# Patient Record
Sex: Male | Born: 2000 | Race: Black or African American | Hispanic: No | Marital: Single | Smoking: Never smoker
Health system: Southern US, Community
[De-identification: ages and names within clinical notes are randomized; demographics above are authoritative.]

---

## 2020-04-07 ENCOUNTER — Emergency Department (HOSPITAL_BASED_OUTPATIENT_CLINIC_OR_DEPARTMENT_OTHER)
Admission: EM | Admit: 2020-04-07 | Discharge: 2020-04-08 | Disposition: A | Discharge status: Home / Self Care | Payer: 59 | Attending: Emergency Medicine | Admitting: Emergency Medicine

## 2020-04-07 ENCOUNTER — Other Ambulatory Visit: Payer: Self-pay

## 2020-04-07 ENCOUNTER — Encounter (HOSPITAL_BASED_OUTPATIENT_CLINIC_OR_DEPARTMENT_OTHER): Payer: Self-pay | Admitting: *Deleted

## 2020-04-07 DIAGNOSIS — H9201 Otalgia, right ear: Secondary | ICD-10-CM | POA: Diagnosis present

## 2020-04-07 DIAGNOSIS — H66001 Acute suppurative otitis media without spontaneous rupture of ear drum, right ear: Secondary | ICD-10-CM | POA: Insufficient documentation

## 2020-04-07 NOTE — ED Triage Notes (Signed)
Pt c/o right ear pain since 2130

## 2020-04-08 DIAGNOSIS — H66001 Acute suppurative otitis media without spontaneous rupture of ear drum, right ear: Secondary | ICD-10-CM | POA: Diagnosis not present

## 2020-04-08 MED ORDER — SALINE SPRAY 0.65 % NA SOLN: 1.0000 | NASAL | 0 refills | Status: AC | PRN

## 2020-04-08 MED ORDER — AMOXICILLIN-POT CLAVULANATE 875-125 MG PO TABS: 1.0000 | ORAL_TABLET | Freq: Two times a day (BID) | ORAL | 0 refills | Status: AC

## 2020-04-08 MED ORDER — KETOROLAC TROMETHAMINE 15 MG/ML IJ SOLN
15.0000 mg | Freq: Once | INTRAMUSCULAR | Status: AC
  Administered 2020-04-08: 15 mg via INTRAMUSCULAR
  Filled 2020-04-08: qty 1

## 2020-04-08 MED ORDER — FLUTICASONE PROPIONATE 50 MCG/ACT NA SUSP: 2.0000 | Freq: Every day | NASAL | 0 refills | Status: AC

## 2020-04-08 NOTE — Discharge Instructions (Addendum)
You were seen today for pain in the right ear.  You have evidence of otitis media.  Sometimes this can be virally mediated.  For the next 1 to 2 days take ibuprofen as needed at home for pain.  Use nasal saline and Flonase to flush the nose and try to drain the middle ear.  If you develop fevers or worsening pain and symptoms, you may get antibiotic filled.

## 2020-04-08 NOTE — ED Provider Notes (Signed)
[facility] EMERGENCY DEPARTMENT Provider Note   CSN: [id number] Arrival date & time: 04/07/20  2307     History Chief Complaint  Patient presents with  . Otalgia    Logan Daniels is a 19 y.o. male.  HPI     This is a 19 year old male who presents with right ear pain.  Patient reports acute onset of right ear pain tonight.  States he has had pain for 2 hours.  Rates his pain a 10 out of 10.  He has not taken anything for pain.  The pain radiates into his right neck.  He states he has had somewhat of a sore throat.  No fevers or chills.  No headache.  No recent sick contacts.  Denies congestion.  Patient does state that he felt like he had difficulty hearing earlier today and thinks something may have gotten into his ear.  History reviewed. No pertinent past medical history.  There are no problems to display for this patient.   History reviewed. No pertinent surgical history.     No family history on file.  Social History   Tobacco Use  . Smoking status: Never Smoker  . Smokeless tobacco: Never Used  Substance Use Topics  . Alcohol use: Never  . Drug use: Never    Home Medications Prior to Admission medications   Medication Sig Start Date End Date Taking? Authorizing Provider  amoxicillin-clavulanate (AUGMENTIN) 875-125 MG tablet Take 1 tablet by mouth every 12 (twelve) hours. 04/08/20   Melecio Cueto, Mayer Masker, MD  fluticasone (FLONASE) 50 MCG/ACT nasal spray Place 2 sprays into both nostrils daily. 04/08/20   Adalina Dopson, Mayer Masker, MD  sodium chloride (OCEAN) 0.65 % SOLN nasal spray Place 1 spray into both nostrils as needed for congestion. 04/08/20   Brittania Sudbeck, Mayer Masker, MD    Allergies    Patient has no known allergies.  Review of Systems   Review of Systems  Constitutional: Negative for fever.  HENT: Positive for ear pain and sore throat. Negative for congestion.   Respiratory: Negative for shortness of breath.   Cardiovascular: Negative for chest pain.    Gastrointestinal: Negative for abdominal pain.  Genitourinary: Negative for dysuria.  All other systems reviewed and are negative.   Physical Exam Updated Vital Signs BP 131/80 (BP Location: Right Arm)   Pulse 71   Temp 98.8 F (37.1 C) (Oral)   Resp 16   Ht 1.88 m (6\' 2" )   Wt 79.4 kg   SpO2 100%   BMI 22.47 kg/m   Physical Exam Vitals and nursing note reviewed.  Constitutional:      Appearance: He is well-developed.  HENT:     Head: Normocephalic and atraumatic.     Left Ear: Tympanic membrane normal.     Ears:     Comments: Right TM slightly bulging with erythema, purulent effusion noted, TM intact    Nose: Nose normal.     Mouth/Throat:     Mouth: Mucous membranes are moist.     Pharynx: Oropharynx is clear. No posterior oropharyngeal erythema.  Eyes:     Pupils: Pupils are equal, round, and reactive to light.  Cardiovascular:     Rate and Rhythm: Normal rate and regular rhythm.     Heart sounds: Normal heart sounds.  Pulmonary:     Effort: Pulmonary effort is normal. No respiratory distress.     Breath sounds: Normal breath sounds.  Abdominal:     Palpations: Abdomen is soft.  Tenderness: There is no abdominal tenderness.  Musculoskeletal:     Cervical back: Neck supple.     Right lower leg: No edema.     Left lower leg: No edema.  Lymphadenopathy:     Cervical: Cervical adenopathy present.  Skin:    General: Skin is warm and dry.  Neurological:     Mental Status: He is alert and oriented to person, place, and time.  Psychiatric:        Mood and Affect: Mood normal.     ED Results / Procedures / Treatments   Labs (all labs ordered are listed, but only abnormal results are displayed) Labs Reviewed - No data to display  EKG None  Radiology No results found.  Procedures Procedures (including critical care time)  Medications Ordered in ED Medications  ketorolac (TORADOL) 15 MG/ML injection 15 mg (has no administration in time range)     ED Course  I have reviewed the triage vital signs and the nursing notes.  Pertinent labs & imaging results that were available during my care of the patient were reviewed by me and considered in my medical decision making (see chart for details).    MDM Rules/Calculators/A&P                      Patient presents with right otalgia.  Fairly acute onset.  Also reports some sore throat.  He is overall nontoxic and vital signs are reassuring.  He is afebrile.  Clinical exam with evidence of a bulging right TM with some erythema consistent with otitis media.  Given that he is afebrile and otherwise well-appearing, we discussed supportive measures including ibuprofen for pain, nasal saline and Flonase to try to drain the middle ear.  If he has persistent symptoms or develops fevers or systemic symptoms, he will be given a prescription for Augmentin.  He was given IM Toradol for pain in the emergency room.  After history, exam, and medical workup I feel the patient has been appropriately medically screened and is safe for discharge home. Pertinent diagnoses were discussed with the patient. Patient was given return precautions.   Final Clinical Impression(s) / ED Diagnoses Final diagnoses:  Non-recurrent acute suppurative otitis media of right ear without spontaneous rupture of tympanic membrane    Rx / DC Orders ED Discharge Orders         Ordered    fluticasone (FLONASE) 50 MCG/ACT nasal spray  Daily     04/08/20 0009    sodium chloride (OCEAN) 0.65 % SOLN nasal spray  As needed     04/08/20 0009    amoxicillin-clavulanate (AUGMENTIN) 875-125 MG tablet  Every 12 hours     04/08/20 0009           Onia Shiflett, Barbette Hair, MD 04/08/20 [phone]

## 2021-03-25 ENCOUNTER — Emergency Department (HOSPITAL_BASED_OUTPATIENT_CLINIC_OR_DEPARTMENT_OTHER): Payer: Medicaid Other

## 2021-03-25 ENCOUNTER — Emergency Department (HOSPITAL_BASED_OUTPATIENT_CLINIC_OR_DEPARTMENT_OTHER)
Admission: EM | Admit: 2021-03-25 | Discharge: 2021-03-25 | Disposition: A | Discharge status: Home / Self Care | Payer: Medicaid Other | Attending: Emergency Medicine | Admitting: Emergency Medicine

## 2021-03-25 ENCOUNTER — Other Ambulatory Visit: Payer: Self-pay

## 2021-03-25 ENCOUNTER — Encounter (HOSPITAL_BASED_OUTPATIENT_CLINIC_OR_DEPARTMENT_OTHER): Payer: Self-pay | Admitting: *Deleted

## 2021-03-25 DIAGNOSIS — Y9302 Activity, running: Secondary | ICD-10-CM | POA: Insufficient documentation

## 2021-03-25 DIAGNOSIS — W01198A Fall on same level from slipping, tripping and stumbling with subsequent striking against other object, initial encounter: Secondary | ICD-10-CM | POA: Diagnosis not present

## 2021-03-25 DIAGNOSIS — S62336A Displaced fracture of neck of fifth metacarpal bone, right hand, initial encounter for closed fracture: Secondary | ICD-10-CM | POA: Diagnosis not present

## 2021-03-25 DIAGNOSIS — S6991XA Unspecified injury of right wrist, hand and finger(s), initial encounter: Secondary | ICD-10-CM | POA: Diagnosis present

## 2021-03-25 MED ORDER — OXYCODONE-ACETAMINOPHEN 5-325 MG PO TABS
1.0000 | ORAL_TABLET | Freq: Once | ORAL | Status: AC
  Administered 2021-03-25: 1 via ORAL
  Filled 2021-03-25: qty 1

## 2021-03-25 MED ORDER — OXYCODONE-ACETAMINOPHEN 5-325 MG PO TABS: 1.0000 | ORAL_TABLET | Freq: Three times a day (TID) | ORAL | 0 refills | Status: AC | PRN

## 2021-03-25 NOTE — ED Triage Notes (Signed)
Right hand and wrist injury. He was running and tripped.

## 2021-03-25 NOTE — Discharge Instructions (Signed)
Call the office listed below to set up a follow-up appointment for further evaluation of your hand fracture. Take ibuprofen 3 times a day with meals.  Do not take other anti-inflammatories at the same time (Advil, Motrin, naproxen, Aleve). You may supplement with Tylenol if you need further pain control. Use Percocet as needed for severe breakthrough pain.  Have caution, this is groggy.  Do not drive or operate machinery or take this medicine. Keep your hand elevated to help with pain and swelling. Use ice 3 times a day to help with pain and swelling. Return to the emergency room develop severe worsening pain, color change of your fingers, or any new, worsening, or concerning symptoms

## 2021-03-25 NOTE — ED Provider Notes (Signed)
[facility] EMERGENCY DEPARTMENT Provider Note   CSN: [id number] Arrival date & time: 03/25/21  1827     History Chief Complaint  Patient presents with  . Hand Injury  . Wrist Injury    Logan Daniels is a 20 y.o. male presenting for evaluation of right hand injury.  Patient states just prior to arrival he was running when he tripped and fell, landing on his right hand.  He reports acute onset pain and swelling of his right ulnar hand mostly the dorsal aspect.  He has pain with dorsiflexion of the hand and pinky finger.  There is no numbness or tingling.  No other injuries in the fall, did not hit his head or lose consciousness.  He has no medical problems, takes medications daily.  He has not taken anything for pain.  Pain does not radiate.  It is constant, worse with movement and palpation, nothing makes it better.  HPI     History reviewed. No pertinent past medical history.  There are no problems to display for this patient.   History reviewed. No pertinent surgical history.     No family history on file.  Social History   Tobacco Use  . Smoking status: Never Smoker  . Smokeless tobacco: Never Used  Substance Use Topics  . Alcohol use: Never  . Drug use: Never    Home Medications Prior to Admission medications   Medication Sig Start Date End Date Taking? Authorizing Provider  oxyCODONE-acetaminophen (PERCOCET/ROXICET) 5-325 MG tablet Take 1 tablet by mouth every 8 (eight) hours as needed for severe pain. 03/25/21  Yes Mercede Rollo, PA-C  amoxicillin-clavulanate (AUGMENTIN) 875-125 MG tablet Take 1 tablet by mouth every 12 (twelve) hours. 04/08/20   Horton, Mayer Masker, MD  fluticasone (FLONASE) 50 MCG/ACT nasal spray Place 2 sprays into both nostrils daily. 04/08/20   Horton, Mayer Masker, MD  sodium chloride (OCEAN) 0.65 % SOLN nasal spray Place 1 spray into both nostrils as needed for congestion. 04/08/20   Horton, Mayer Masker, MD    Allergies    Patient  has no known allergies.  Review of Systems   Review of Systems  Musculoskeletal: Positive for arthralgias and joint swelling.  Hematological: Does not bruise/bleed easily.    Physical Exam Updated Vital Signs BP 122/61 (BP Location: Left Arm)   Pulse (!) 108   Temp 99.4 F (37.4 C) (Oral)   Resp 20   Ht 6\' 2"  (1.88 m)   Wt 90.7 kg   SpO2 94%   BMI 25.68 kg/m   Physical Exam Vitals and nursing note reviewed.  Constitutional:      General: He is not in acute distress.    Appearance: He is well-developed.  HENT:     Head: Normocephalic and atraumatic.  Pulmonary:     Effort: Pulmonary effort is normal.  Abdominal:     General: There is no distension.  Musculoskeletal:        General: Swelling and tenderness present. Normal range of motion.       Hands:     Cervical back: Normal range of motion.     Comments: Hematoma of hte R ulnar hand.  Tenderness palpation over the fifth metacarpal.  Patient able to flex and extend the pinky with pain.  Good distal sensation and cap refill.  No tenderness palpation over the carpals or distal radius or ulna.  Increased pain with flexion of the wrist.  No pain of the thumb or over the anatomic snuffbox  Skin:    General: Skin is warm.     Findings: No rash.  Neurological:     Mental Status: He is alert and oriented to person, place, and time.     ED Results / Procedures / Treatments   Labs (all labs ordered are listed, but only abnormal results are displayed) Labs Reviewed - No data to display  EKG None  Radiology DG Wrist Complete Right  Result Date: 03/25/2021 CLINICAL DATA:  Right hand and wrist injury.  Fall EXAM: RIGHT WRIST - COMPLETE 3+ VIEW COMPARISON:  Right hand series today FINDINGS: There is no evidence of fracture or dislocation. There is no evidence of arthropathy or other focal bone abnormality. Soft tissues are unremarkable. IMPRESSION: Negative. Electronically Signed   By: Charlett Nose M.D.   On: 03/25/2021  19:11   DG Hand Complete Right  Result Date: 03/25/2021 CLINICAL DATA:  Fall, right hand and wrist pain EXAM: RIGHT HAND - COMPLETE 3+ VIEW COMPARISON:  Wrist series today FINDINGS: There is a mildly comminuted and ankle fracture through the distal right 5th metacarpal. No subluxation or dislocation. No additional acute bony abnormality. IMPRESSION: Angulated, comminuted distal right 5th metacarpal fracture. Electronically Signed   By: Charlett Nose M.D.   On: 03/25/2021 19:11    Procedures Procedures   Medications Ordered in ED Medications  oxyCODONE-acetaminophen (PERCOCET/ROXICET) 5-325 MG per tablet 1 tablet (has no administration in time range)    ED Course  I have reviewed the triage vital signs and the nursing notes.  Pertinent labs & imaging results that were available during my care of the patient were reviewed by me and considered in my medical decision making (see chart for details).    MDM Rules/Calculators/A&P                          Patient presenting for evaluation of right hand and wrist pain after fall.  On exam, patient is neurovascularly intact.  He does.  Noticing swelling.  Will obtain x-rays to ensure no bony abnormality.  X-rays viewed interpreted by me, shows distal fifth metacarpal fracture.  Discussed findings with patient.  Will place an ulnar gutter splint and have him follow-up with hand.  At this time, patient appears safe for discharge.  Return precautions given.  Patient states he understands and agrees to plan.   Final Clinical Impression(s) / ED Diagnoses Final diagnoses:  Closed displaced fracture of neck of fifth metacarpal bone of right hand, initial encounter    Rx / DC Orders ED Discharge Orders         Ordered    oxyCODONE-acetaminophen (PERCOCET/ROXICET) 5-325 MG tablet  Every 8 hours PRN        03/25/21 1930           Alveria Apley, PA-C 03/25/21 1931    Gwyneth Sprout, MD 03/25/21 2336

## 2021-04-04 ENCOUNTER — Emergency Department (HOSPITAL_BASED_OUTPATIENT_CLINIC_OR_DEPARTMENT_OTHER)
Admission: EM | Admit: 2021-04-04 | Discharge: 2021-04-04 | Disposition: A | Discharge status: Home / Self Care | Payer: Medicaid Other | Attending: Emergency Medicine | Admitting: Emergency Medicine

## 2021-04-04 ENCOUNTER — Other Ambulatory Visit: Payer: Self-pay

## 2021-04-04 DIAGNOSIS — H6691 Otitis media, unspecified, right ear: Secondary | ICD-10-CM | POA: Diagnosis not present

## 2021-04-04 DIAGNOSIS — J029 Acute pharyngitis, unspecified: Secondary | ICD-10-CM | POA: Insufficient documentation

## 2021-04-04 DIAGNOSIS — H9201 Otalgia, right ear: Secondary | ICD-10-CM | POA: Diagnosis present

## 2021-04-04 MED ORDER — AMOXICILLIN 500 MG PO CAPS
1000.0000 mg | ORAL_CAPSULE | Freq: Two times a day (BID) | ORAL | 0 refills | Status: AC
Start: 2021-04-04 — End: 2021-04-11

## 2021-04-04 NOTE — ED Provider Notes (Signed)
[facility] EMERGENCY DEPARTMENT Provider Note   CSN: [id number] Arrival date & time: 04/04/21  2039     History Chief Complaint  Patient presents with  . Otalgia  . Sore Throat    Logan Daniels is a 20 y.o. male.   Otalgia Location:  Right Behind ear:  No abnormality Quality:  Aching Severity:  Mild Onset quality:  Gradual Timing:  Constant Progression:  Worsening Chronicity:  New Relieved by:  Nothing Worsened by:  Nothing Ineffective treatments:  None tried Associated symptoms: sore throat   Associated symptoms: no congestion, no cough, no diarrhea, no fever, no headaches, no rash, no rhinorrhea and no vomiting   Sore Throat Pertinent negatives include no chest pain, no headaches and no shortness of breath.       No past medical history on file.  There are no problems to display for this patient.   No past surgical history on file.     No family history on file.  Social History   Tobacco Use  . Smoking status: Never Smoker  . Smokeless tobacco: Never Used  Substance Use Topics  . Alcohol use: Never  . Drug use: Never    Home Medications Prior to Admission medications   Medication Sig Start Date End Date Taking? Authorizing Provider  amoxicillin (AMOXIL) 500 MG capsule Take 2 capsules (1,000 mg total) by mouth 2 (two) times daily for 7 days. 04/04/21 04/11/21 Yes Sabino Donovan, MD  amoxicillin-clavulanate (AUGMENTIN) 875-125 MG tablet Take 1 tablet by mouth every 12 (twelve) hours. 04/08/20   Horton, Mayer Masker, MD  fluticasone (FLONASE) 50 MCG/ACT nasal spray Place 2 sprays into both nostrils daily. 04/08/20   Horton, Mayer Masker, MD  oxyCODONE-acetaminophen (PERCOCET/ROXICET) 5-325 MG tablet Take 1 tablet by mouth every 8 (eight) hours as needed for severe pain. 03/25/21   Caccavale, Sophia, PA-C  sodium chloride (OCEAN) 0.65 % SOLN nasal spray Place 1 spray into both nostrils as needed for congestion. 04/08/20   Horton, Mayer Masker, MD     Allergies    Patient has no known allergies.  Review of Systems   Review of Systems  Constitutional: Negative for chills and fever.  HENT: Positive for ear pain and sore throat. Negative for congestion and rhinorrhea.   Respiratory: Negative for cough and shortness of breath.   Cardiovascular: Negative for chest pain and palpitations.  Gastrointestinal: Negative for diarrhea, nausea and vomiting.  Genitourinary: Negative for difficulty urinating and dysuria.  Musculoskeletal: Negative for arthralgias and back pain.  Skin: Negative for color change and rash.  Neurological: Negative for light-headedness and headaches.    Physical Exam Updated Vital Signs BP 132/90 (BP Location: Right Arm)   Pulse 83   Temp 99.9 F (37.7 C) (Oral)   Resp 16   SpO2 98%   Physical Exam Vitals and nursing note reviewed.  Constitutional:      General: He is not in acute distress.    Appearance: Normal appearance.  HENT:     Head: Normocephalic and atraumatic.     Right Ear: A middle ear effusion is present. Tympanic membrane is erythematous.     Left Ear: Tympanic membrane normal.     Nose: No rhinorrhea.  Eyes:     General:        Right eye: No discharge.        Left eye: No discharge.     Conjunctiva/sclera: Conjunctivae normal.  Cardiovascular:     Rate and Rhythm: Normal rate and  regular rhythm.  Pulmonary:     Effort: Pulmonary effort is normal.     Breath sounds: No stridor.  Abdominal:     General: Abdomen is flat. There is no distension.     Palpations: Abdomen is soft.  Musculoskeletal:        General: No deformity or signs of injury.  Skin:    General: Skin is warm and dry.  Neurological:     General: No focal deficit present.     Mental Status: He is alert. Mental status is at baseline.     Motor: No weakness.  Psychiatric:        Mood and Affect: Mood normal.        Behavior: Behavior normal.        Thought Content: Thought content normal.     ED Results /  Procedures / Treatments   Labs (all labs ordered are listed, but only abnormal results are displayed) Labs Reviewed - No data to display  EKG None  Radiology No results found.  Procedures Procedures   Medications Ordered in ED Medications - No data to display  ED Course  I have reviewed the triage vital signs and the nursing notes.  Pertinent labs & imaging results that were available during my care of the patient were reviewed by me and considered in my medical decision making (see chart for details).    MDM Rules/Calculators/A&P                          Sore throat no signs of deep space infection.  Right otitis media.  Will treat with high-dose amoxicillin, this will cover for strep pyogenes as well.  Anti-inflammatories recommended tolerating p.o. well-hydrated normal work of breathing.  Able to tolerate p.o.  Safe for discharge home return precautions discussed Final Clinical Impression(s) / ED Diagnoses Final diagnoses:  Right otitis media, unspecified otitis media type  Sore throat    Rx / DC Orders ED Discharge Orders         Ordered    amoxicillin (AMOXIL) 500 MG capsule  2 times daily        04/04/21 2114           Sabino Donovan, MD 04/04/21 2137

## 2021-04-04 NOTE — ED Notes (Signed)
Difficult in swallowing, it feels like it is swollen.

## 2021-04-04 NOTE — ED Triage Notes (Signed)
Pt presents to ED POV. Pt c/o R ear pain and sore throat x1w. Denies any sick contacts denies fever

## 2021-04-04 NOTE — Discharge Instructions (Signed)
You can take 600 mg of ibuprofen every 6 hours, you can take 1000 mg of Tylenol every 6 hours, you can alternate these every 3 or you can take them together.  

## 2021-10-27 ENCOUNTER — Emergency Department (HOSPITAL_BASED_OUTPATIENT_CLINIC_OR_DEPARTMENT_OTHER)
Admission: EM | Admit: 2021-10-27 | Discharge: 2021-10-27 | Disposition: A | Discharge status: Home / Self Care | Payer: Medicaid Other | Attending: Student | Admitting: Student

## 2021-10-27 ENCOUNTER — Other Ambulatory Visit: Payer: Self-pay

## 2021-10-27 DIAGNOSIS — R059 Cough, unspecified: Secondary | ICD-10-CM | POA: Diagnosis present

## 2021-10-27 DIAGNOSIS — J101 Influenza due to other identified influenza virus with other respiratory manifestations: Secondary | ICD-10-CM

## 2021-10-27 DIAGNOSIS — Z20822 Contact with and (suspected) exposure to covid-19: Secondary | ICD-10-CM | POA: Insufficient documentation

## 2021-10-27 LAB — RESP PANEL BY RT-PCR (FLU A&B, COVID) ARPGX2
Influenza A by PCR: POSITIVE — AB
Influenza B by PCR: NEGATIVE
SARS Coronavirus 2 by RT PCR: NEGATIVE

## 2021-10-27 MED ORDER — OSELTAMIVIR PHOSPHATE 75 MG PO CAPS: 75.0000 mg | ORAL_CAPSULE | Freq: Two times a day (BID) | ORAL | 0 refills | Status: AC

## 2021-10-27 NOTE — ED Triage Notes (Addendum)
Pt c/o cough since yesterday. + flu contact

## 2021-10-27 NOTE — Discharge Instructions (Addendum)
I have prescribed you tamiflu for 5 days. Please return if worsening symptoms.   Read the instructions below on reasons to return to the emergency department and to learn more about your diagnosis.  Use over the counter medications for symptomatic relief as we discussed (musinex as a decongestant, Tylenol for fever/pain, Motrin/Ibuprofen for muscle aches). If prescribed a cough suppressant during your visit, do not operate heavy machinery with in 5 hours of taking this medication. Followup with your primary care doctor in 4 days if your symptoms persist.  Your more than welcome to return to the emergency department if symptoms worsen or become concerning.  Upper Respiratory Infection, Adult  An upper respiratory infection (URI) is also sometimes known as the common cold. Most people improve within 1 week, but symptoms can last up to 2 weeks. A residual cough may last even longer.   URI is most commonly caused by a virus. Viruses are NOT treated with antibiotics. You can easily spread the virus to others by oral contact. This includes kissing, sharing a glass, coughing, or sneezing. Touching your mouth or nose and then touching a surface, which is then touched by another person, can also spread the virus.   TREATMENT  Treatment is directed at relieving symptoms. There is no cure. Antibiotics are not effective, because the infection is caused by a virus, not by bacteria. Treatment may include:  Increased fluid intake. Sports drinks offer valuable electrolytes, sugars, and fluids.  Breathing heated mist or steam (vaporizer or shower).  Eating chicken soup or other clear broths, and maintaining good nutrition.  Getting plenty of rest.  Using gargles or lozenges for comfort.  Controlling fevers with ibuprofen or acetaminophen as directed by your caregiver.  Increasing usage of your inhaler if you have asthma.  Return to work when your temperature has returned to normal.   SEEK MEDICAL CARE IF:  After  the first few days, you feel you are getting worse rather than better.  You develop worsening shortness of breath, or brown or red sputum. These may be signs of pneumonia.  You develop yellow or brown nasal discharge or pain in the face, especially when you bend forward. These may be signs of sinusitis.  You develop a fever, swollen neck glands, pain with swallowing, or white areas in the back of your throat. These may be signs of strep throat.

## 2021-10-27 NOTE — ED Provider Notes (Signed)
[facility] EMERGENCY DEPARTMENT Provider Note   CSN: [id number] Arrival date & time: 10/27/21  1642     History Chief Complaint  Patient presents with   Cough    Logan Daniels is a 20 y.o. male.  With no notable past medical history.  He presents with flulike symptoms since yesterday.  He had a positive influenza contact.  He has had a cough since yesterday.  Cough is nonproductive.  He denies any fevers, chills, sore throat, congestion, shortness of breath, nausea, vomiting, chest pain, abdominal pain.   Cough Associated symptoms: no chest pain, no chills, no fever, no headaches, no rash, no rhinorrhea, no shortness of breath and no sore throat       No past medical history on file.  There are no problems to display for this patient.   No past surgical history on file.     No family history on file.  Social History   Tobacco Use   Smoking status: Never   Smokeless tobacco: Never  Substance Use Topics   Alcohol use: Never   Drug use: Never    Home Medications Prior to Admission medications   Medication Sig Start Date End Date Taking? Authorizing Provider  oseltamivir (TAMIFLU) 75 MG capsule Take 1 capsule (75 mg total) by mouth every 12 (twelve) hours for 5 days. 10/27/21 11/01/21 Yes Naquisha Whitehair, Finis Bud, PA-C  amoxicillin-clavulanate (AUGMENTIN) 875-125 MG tablet Take 1 tablet by mouth every 12 (twelve) hours. 04/08/20   Horton, Mayer Masker, MD  fluticasone (FLONASE) 50 MCG/ACT nasal spray Place 2 sprays into both nostrils daily. 04/08/20   Horton, Mayer Masker, MD  oxyCODONE-acetaminophen (PERCOCET/ROXICET) 5-325 MG tablet Take 1 tablet by mouth every 8 (eight) hours as needed for severe pain. 03/25/21   Caccavale, Sophia, PA-C  sodium chloride (OCEAN) 0.65 % SOLN nasal spray Place 1 spray into both nostrils as needed for congestion. 04/08/20   Horton, Mayer Masker, MD    Allergies    Patient has no known allergies.  Review of Systems   Review of Systems   Constitutional:  Negative for chills and fever.  HENT:  Negative for congestion, rhinorrhea and sore throat.   Eyes:  Negative for visual disturbance.  Respiratory:  Positive for cough. Negative for chest tightness and shortness of breath.   Cardiovascular:  Negative for chest pain, palpitations and leg swelling.  Gastrointestinal:  Negative for abdominal pain, blood in stool, constipation, diarrhea, nausea and vomiting.  Genitourinary:  Negative for dysuria, flank pain and hematuria.  Musculoskeletal:  Negative for back pain.  Skin:  Negative for rash and wound.  Neurological:  Negative for dizziness, syncope, weakness, light-headedness and headaches.  Psychiatric/Behavioral:  Negative for confusion.   All other systems reviewed and are negative.  Physical Exam Updated Vital Signs BP (!) 131/98 (BP Location: Left Arm)   Pulse 82   Temp 98.8 F (37.1 C) (Oral)   Resp 18   Ht 6\' 2"  (1.88 m)   Wt 90.7 kg   SpO2 98%   BMI 25.68 kg/m   Physical Exam Vitals and nursing note reviewed.  Constitutional:      General: He is not in acute distress.    Appearance: Normal appearance. He is not ill-appearing, toxic-appearing or diaphoretic.  HENT:     Head: Normocephalic and atraumatic.     Right Ear: Tympanic membrane, ear canal and external ear normal. There is no impacted cerumen.     Left Ear: Tympanic membrane, ear canal and external  ear normal. There is no impacted cerumen.     Nose: Nose normal. No nasal deformity, congestion or rhinorrhea.     Mouth/Throat:     Lips: Pink. No lesions.     Mouth: Mucous membranes are moist. No injury, lacerations, oral lesions or angioedema.     Pharynx: Oropharynx is clear. Uvula midline. No pharyngeal swelling, oropharyngeal exudate, posterior oropharyngeal erythema or uvula swelling.  Eyes:     General: Gaze aligned appropriately. No scleral icterus.       Right eye: No discharge.        Left eye: No discharge.     Conjunctiva/sclera:  Conjunctivae normal.     Right eye: Right conjunctiva is not injected. No exudate or hemorrhage.    Left eye: Left conjunctiva is not injected. No exudate or hemorrhage. Cardiovascular:     Rate and Rhythm: Normal rate and regular rhythm.     Pulses: Normal pulses.          Radial pulses are 2+ on the right side and 2+ on the left side.       Dorsalis pedis pulses are 2+ on the right side and 2+ on the left side.     Heart sounds: Normal heart sounds, S1 normal and S2 normal. Heart sounds not distant. No murmur heard.   No friction rub. No gallop. No S3 or S4 sounds.  Pulmonary:     Effort: Pulmonary effort is normal. No accessory muscle usage or respiratory distress.     Breath sounds: Normal breath sounds. No stridor. No wheezing, rhonchi or rales.  Chest:     Chest wall: No tenderness.  Abdominal:     General: Abdomen is flat. There is no distension.     Palpations: Abdomen is soft. There is no mass or pulsatile mass.     Tenderness: There is no abdominal tenderness. There is no guarding or rebound.  Musculoskeletal:     Cervical back: Neck supple.     Right lower leg: No edema.     Left lower leg: No edema.  Lymphadenopathy:     Cervical: No cervical adenopathy.  Skin:    General: Skin is warm and dry.     Coloration: Skin is not jaundiced or pale.     Findings: No bruising, erythema, lesion or rash.  Neurological:     General: No focal deficit present.     Mental Status: He is alert and oriented to person, place, and time.     GCS: GCS eye subscore is 4. GCS verbal subscore is 5. GCS motor subscore is 6.  Psychiatric:        Mood and Affect: Mood normal.        Behavior: Behavior normal. Behavior is cooperative.    ED Results / Procedures / Treatments   Labs (all labs ordered are listed, but only abnormal results are displayed) Labs Reviewed  RESP PANEL BY RT-PCR (FLU A&B, COVID) ARPGX2 - Abnormal; Notable for the following components:      Result Value   Influenza A  by PCR POSITIVE (*)    All other components within normal limits    EKG None  Radiology No results found.  Procedures Procedures   Medications Ordered in ED Medications - No data to display  ED Course  I have reviewed the triage vital signs and the nursing notes.  Pertinent labs & imaging results that were available during my care of the patient were reviewed by me and considered in  my medical decision making (see chart for details).    MDM Rules/Calculators/A&P                         This is a well-appearing 20 year old male who has had a cough since yesterday.  He had positive flu contact.  His son is also sick.  His vitals are stable and he is afebrile.  Exam unremarkable with normal lung sounds, normal HEENT exam, appears to be in no acute distress.  He did test positive for influenza.  He has within the Tamiflu window.  Will prescribe 5-day course.  Return precautions provided.  Recommend supportive treatment for other symptoms.   Final Clinical Impression(s) / ED Diagnoses Final diagnoses:  Influenza A    Rx / DC Orders ED Discharge Orders          Ordered    oseltamivir (TAMIFLU) 75 MG capsule  Every 12 hours        10/27/21 1834             Claudie Leach, PA-C 10/27/21 2317    Glendora Score, MD 10/28/21 [phone]

## 2021-11-30 ENCOUNTER — Encounter (HOSPITAL_BASED_OUTPATIENT_CLINIC_OR_DEPARTMENT_OTHER): Payer: Self-pay

## 2021-11-30 ENCOUNTER — Emergency Department (HOSPITAL_BASED_OUTPATIENT_CLINIC_OR_DEPARTMENT_OTHER)
Admission: EM | Admit: 2021-11-30 | Discharge: 2021-11-30 | Disposition: A | Discharge status: Home / Self Care | Payer: Medicaid Other | Attending: Emergency Medicine | Admitting: Emergency Medicine

## 2021-11-30 ENCOUNTER — Other Ambulatory Visit: Payer: Self-pay

## 2021-11-30 DIAGNOSIS — R369 Urethral discharge, unspecified: Secondary | ICD-10-CM | POA: Insufficient documentation

## 2021-11-30 DIAGNOSIS — Z113 Encounter for screening for infections with a predominantly sexual mode of transmission: Secondary | ICD-10-CM | POA: Insufficient documentation

## 2021-11-30 LAB — URINALYSIS, ROUTINE W REFLEX MICROSCOPIC
Bilirubin Urine: NEGATIVE
Glucose, UA: NEGATIVE mg/dL
Ketones, ur: NEGATIVE mg/dL
Nitrite: NEGATIVE
Specific Gravity, Urine: >=1.03 (ref 1.005–1.030)
pH: 6 (ref 5.0–8.0)

## 2021-11-30 LAB — URINALYSIS, MICROSCOPIC (REFLEX)

## 2021-11-30 MED ORDER — DOXYCYCLINE HYCLATE 100 MG PO CAPS
100.0000 mg | ORAL_CAPSULE | Freq: Two times a day (BID) | ORAL | 0 refills | Status: AC
Start: 2021-11-30 — End: ?

## 2021-11-30 MED ORDER — METRONIDAZOLE 500 MG PO TABS
2000.0000 mg | ORAL_TABLET | Freq: Once | ORAL | Status: AC
  Administered 2021-11-30: 22:00:00 2000 mg via ORAL
  Filled 2021-11-30: qty 4

## 2021-11-30 MED ORDER — CEFTRIAXONE SODIUM 500 MG IJ SOLR
500.0000 mg | Freq: Once | INTRAMUSCULAR | Status: AC
  Administered 2021-11-30: 22:00:00 500 mg via INTRAMUSCULAR
  Filled 2021-11-30: qty 500

## 2021-11-30 NOTE — ED Notes (Signed)
Awaiting results NAD

## 2021-11-30 NOTE — ED Triage Notes (Addendum)
Pt c/o penile d/c and dysuria x today-NAD-steady gait

## 2021-11-30 NOTE — ED Provider Notes (Signed)
[facility] EMERGENCY DEPARTMENT Provider Note   CSN: [id number] Arrival date & time: 11/30/21  2025     History Chief Complaint  Patient presents with   Penile Discharge    Logan Daniels is a 20 y.o. male presenting for an STD check.  He reports that this morning he had burning and yellowish discharge coming from his penis.  Every time he has urinated since then has also been burning.  He has not seen any more discharge.  Sexually active with 1 male partner.  No abdominal pain, fever, chills or testicular pain.    History reviewed. No pertinent past medical history.  There are no problems to display for this patient.   History reviewed. No pertinent surgical history.     No family history on file.  Social History   Tobacco Use   Smoking status: Never   Smokeless tobacco: Never  Vaping Use   Vaping Use: Never used  Substance Use Topics   Alcohol use: Yes    Comment: occ   Drug use: Yes    Types: Marijuana    Home Medications Prior to Admission medications   Medication Sig Start Date End Date Taking? Authorizing Provider  doxycycline (VIBRAMYCIN) 100 MG capsule Take 1 capsule (100 mg total) by mouth 2 (two) times daily. 11/30/21  Yes Saadia Dewitt A, PA-C  amoxicillin-clavulanate (AUGMENTIN) 875-125 MG tablet Take 1 tablet by mouth every 12 (twelve) hours. 04/08/20   Horton, Barbette Hair, MD  fluticasone (FLONASE) 50 MCG/ACT nasal spray Place 2 sprays into both nostrils daily. 04/08/20   Horton, Barbette Hair, MD  oxyCODONE-acetaminophen (PERCOCET/ROXICET) 5-325 MG tablet Take 1 tablet by mouth every 8 (eight) hours as needed for severe pain. 03/25/21   Caccavale, Sophia, PA-C  sodium chloride (OCEAN) 0.65 % SOLN nasal spray Place 1 spray into both nostrils as needed for congestion. 04/08/20   Horton, Barbette Hair, MD    Allergies    Patient has no known allergies.  Review of Systems   Review of Systems  Constitutional:  Negative for chills and fever.   Gastrointestinal:  Negative for abdominal pain.  Genitourinary:  Positive for dysuria and penile discharge. Negative for testicular pain.   Physical Exam Updated Vital Signs BP 132/81 (BP Location: Left Arm)    Pulse 98    Temp 98.6 F (37 C) (Oral)    Resp 18    Ht 6\' 2"  (1.88 m)    Wt 94.3 kg    SpO2 96%    BMI 26.71 kg/m   Physical Exam Vitals and nursing note reviewed.  Constitutional:      Appearance: Normal appearance.  HENT:     Head: Normocephalic and atraumatic.  Eyes:     General: No scleral icterus.    Conjunctiva/sclera: Conjunctivae normal.  Pulmonary:     Effort: Pulmonary effort is normal. No respiratory distress.  Genitourinary:    Comments: Deferred by patient request Skin:    Findings: No rash.  Neurological:     Mental Status: He is alert.  Psychiatric:        Mood and Affect: Mood normal.    ED Results / Procedures / Treatments   Labs (all labs ordered are listed, but only abnormal results are displayed) Labs Reviewed  URINALYSIS, ROUTINE W REFLEX MICROSCOPIC  GC/CHLAMYDIA PROBE AMP () NOT AT [facility]    EKG None  Radiology No results found.  Procedures Procedures   Medications Ordered in ED Medications  cefTRIAXone (ROCEPHIN) injection 500  mg (500 mg Intramuscular Given 11/30/21 2151)  metroNIDAZOLE (FLAGYL) tablet 2,000 mg (2,000 mg Oral Given 11/30/21 2151)    ED Course  I have reviewed the triage vital signs and the nursing notes.  Pertinent labs & imaging results that were available during my care of the patient were reviewed by me and considered in my medical decision making (see chart for details).    MDM Rules/Calculators/A&P Due to patient's symptoms, he will be treated with Rocephin, metronidazole and doxycycline which will cover him for gonorrhea, chlamydia and trichomonas.   Cystitis with microscopic hematuria.  Patient notified of this and will get a repeat urine sample with a outpatient clinic that I have put a  referral to.  Reports recent sexual intercourse which may have impacted the urine sample.  Information about STDs has been attached to his discharge papers.  Discharged at this time.  Final Clinical Impression(s) / ED Diagnoses Final diagnoses:  Screening for STD (sexually transmitted disease)    Rx / DC Orders ED Discharge Orders          Ordered    doxycycline (VIBRAMYCIN) 100 MG capsule  2 times daily        11/30/21 2152            Results and diagnoses were explained to the patient. Return precautions discussed in full. Patient had no additional questions and expressed complete understanding.    Woodroe Chen 11/30/21 2245    Alvira Monday, MD 12/03/21 [phone]

## 2021-11-30 NOTE — Discharge Instructions (Addendum)
The shot you received in the emergency department that covers you for gonorrhea.  The pills you received have covered you for trichomonas.  The antibiotic at the pharmacy covers you for chlamydia.  You will be able to see the results of your STD testing in your chart or expect a call if the results are positive.

## 2021-12-02 LAB — URINE CULTURE: Culture: <10000 — AB

## 2021-12-02 LAB — GC/CHLAMYDIA PROBE AMP (~~LOC~~) NOT AT ARMC
Chlamydia: NEGATIVE
Comment: NEGATIVE
Comment: NORMAL
Neisseria Gonorrhea: POSITIVE — AB

## 2022-12-17 IMAGING — DX DG HAND COMPLETE 3+V*R*
3 series · 3 of 3 positions shown · non-contrast
Comparison: Wrist series today

CLINICAL DATA: Fall, right hand and wrist pain

EXAM:
RIGHT HAND - COMPLETE 3+ VIEW

[hand pa]
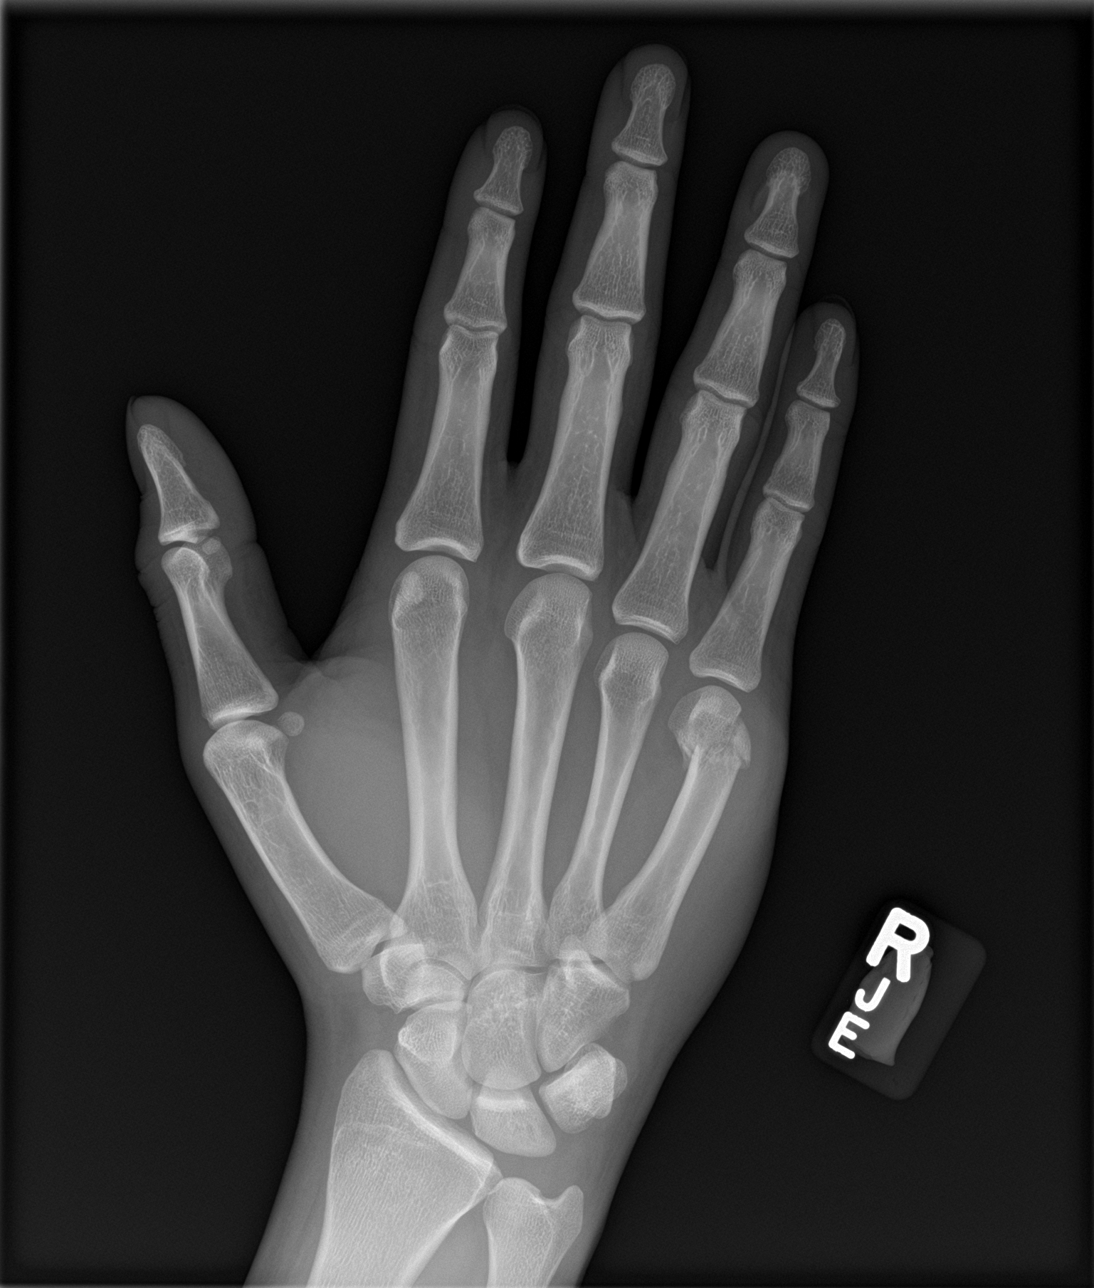

[hand obl]
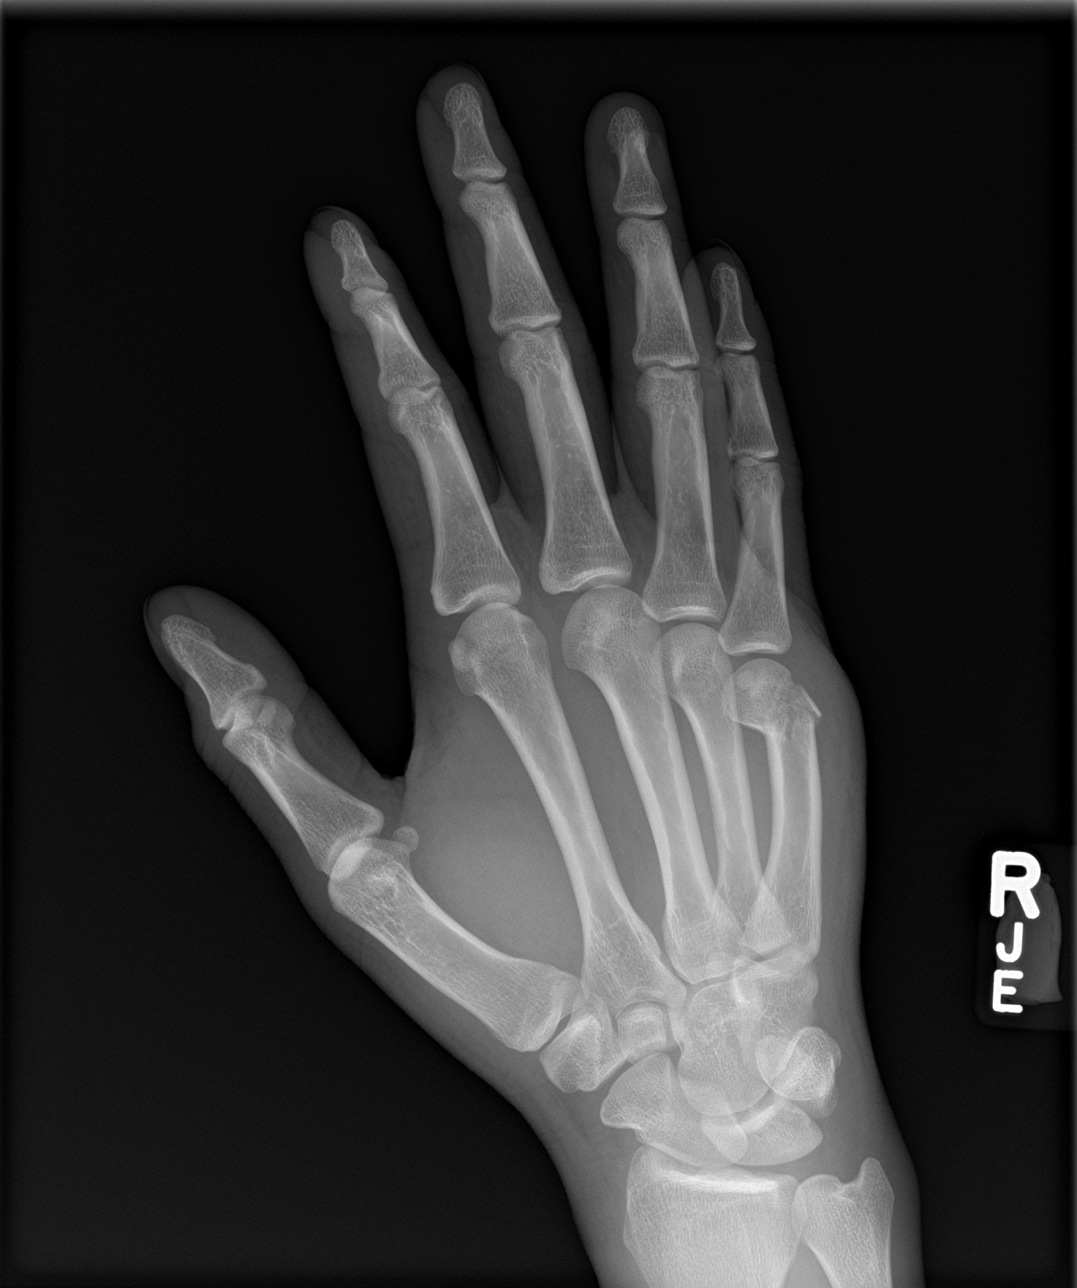

[hand lat]
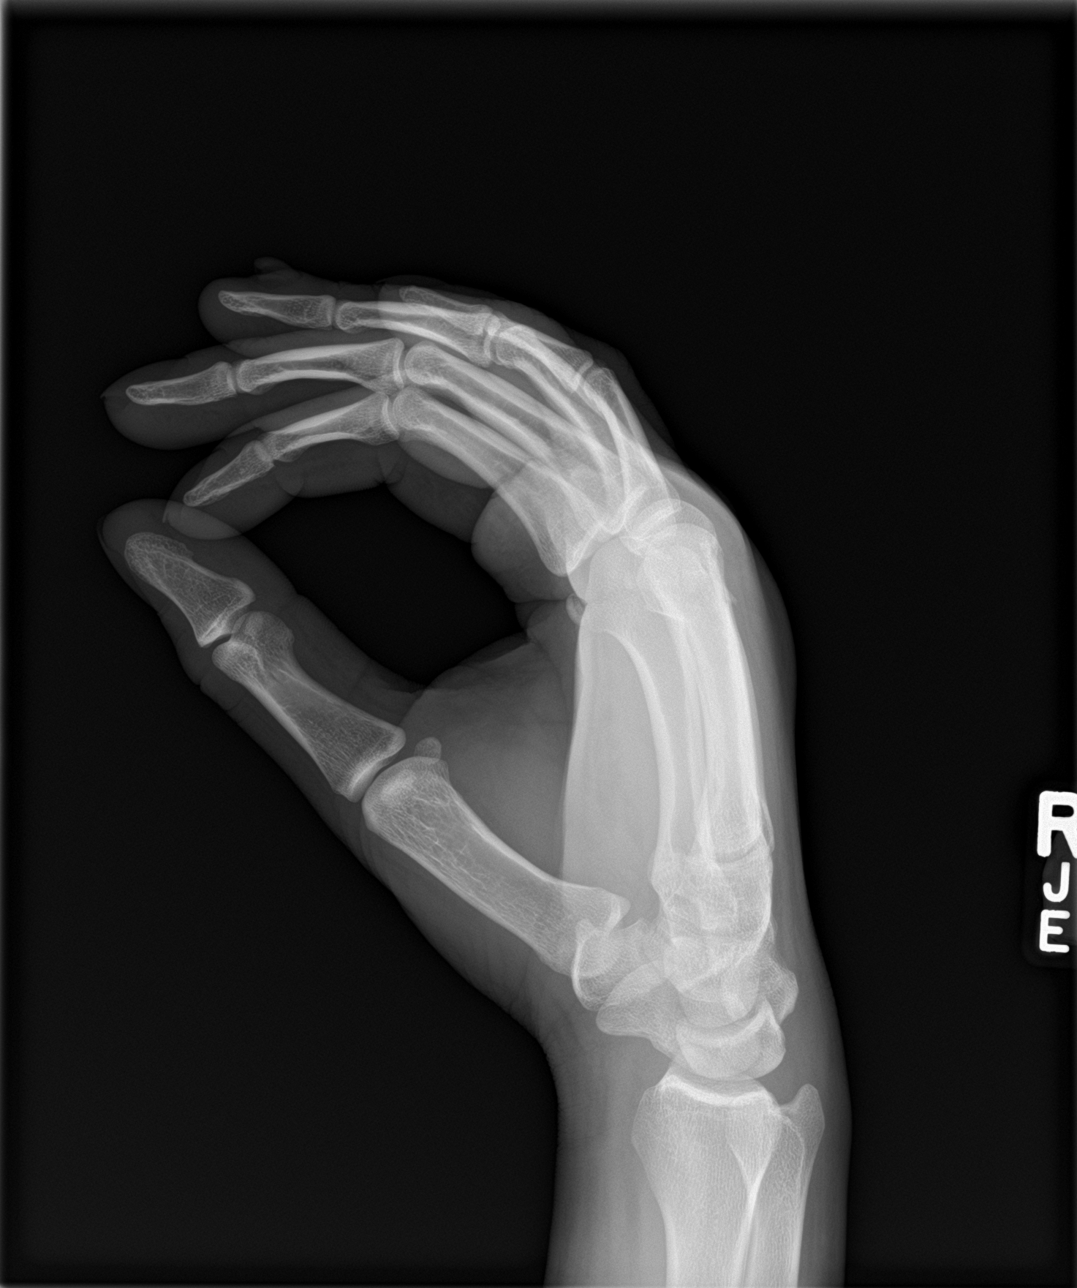

[3 of 3 positions shown; findings below may reference images not displayed]

FINDINGS: There is a mildly comminuted and ankle fracture through the distal
right 5th metacarpal. No subluxation or dislocation. No additional
acute bony abnormality.
IMPRESSION: Angulated, comminuted distal right 5th metacarpal fracture.

## 2024-06-06 DEATH — deceased
